# Patient Record
Sex: Female | Born: 1995 | Race: Black or African American | Hispanic: No | Marital: Single | Smoking: Current every day smoker
Health system: Southern US, Community
[De-identification: ages and names within clinical notes are randomized; demographics above are authoritative.]

## PROBLEM LIST (undated history)

## (undated) DIAGNOSIS — F329 Major depressive disorder, single episode, unspecified: Secondary | ICD-10-CM

## (undated) DIAGNOSIS — F32A Depression, unspecified: Secondary | ICD-10-CM

## (undated) DIAGNOSIS — R51 Headache: Secondary | ICD-10-CM

## (undated) DIAGNOSIS — R519 Headache, unspecified: Secondary | ICD-10-CM

## (undated) DIAGNOSIS — K219 Gastro-esophageal reflux disease without esophagitis: Secondary | ICD-10-CM

## (undated) DIAGNOSIS — S83289A Other tear of lateral meniscus, current injury, unspecified knee, initial encounter: Secondary | ICD-10-CM

## (undated) DIAGNOSIS — J45909 Unspecified asthma, uncomplicated: Secondary | ICD-10-CM

## (undated) HISTORY — PX: NO PAST SURGERIES: SHX2092

---
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
## 2016-07-12 DIAGNOSIS — Z832 Family history of diseases of the blood and blood-forming organs and certain disorders involving the immune mechanism: Secondary | ICD-10-CM

## 2016-08-11 DIAGNOSIS — Z862 Personal history of diseases of the blood and blood-forming organs and certain disorders involving the immune mechanism: Secondary | ICD-10-CM | POA: Diagnosis not present

## 2016-08-20 DIAGNOSIS — N3001 Acute cystitis with hematuria: Secondary | ICD-10-CM | POA: Diagnosis not present

## 2016-08-20 DIAGNOSIS — N760 Acute vaginitis: Secondary | ICD-10-CM | POA: Diagnosis not present

## 2016-08-24 DIAGNOSIS — M5431 Sciatica, right side: Secondary | ICD-10-CM | POA: Diagnosis not present

## 2016-08-24 DIAGNOSIS — M545 Low back pain: Secondary | ICD-10-CM | POA: Diagnosis not present

## 2016-08-24 DIAGNOSIS — M5432 Sciatica, left side: Secondary | ICD-10-CM | POA: Diagnosis not present

## 2016-09-02 DIAGNOSIS — M545 Low back pain: Secondary | ICD-10-CM | POA: Diagnosis not present

## 2016-09-02 DIAGNOSIS — M791 Myalgia: Secondary | ICD-10-CM | POA: Diagnosis not present

## 2016-09-02 DIAGNOSIS — M5431 Sciatica, right side: Secondary | ICD-10-CM | POA: Diagnosis not present

## 2016-09-08 DIAGNOSIS — M545 Low back pain: Secondary | ICD-10-CM | POA: Diagnosis not present

## 2016-09-08 DIAGNOSIS — M5136 Other intervertebral disc degeneration, lumbar region: Secondary | ICD-10-CM | POA: Diagnosis not present

## 2016-09-08 DIAGNOSIS — M5127 Other intervertebral disc displacement, lumbosacral region: Secondary | ICD-10-CM | POA: Diagnosis not present

## 2016-09-15 DIAGNOSIS — M5126 Other intervertebral disc displacement, lumbar region: Secondary | ICD-10-CM | POA: Diagnosis not present

## 2016-09-23 ENCOUNTER — Ambulatory Visit (INDEPENDENT_AMBULATORY_CARE_PROVIDER_SITE_OTHER): Payer: Medicare Other | Admitting: Orthopaedic Surgery

## 2016-09-23 ENCOUNTER — Encounter (INDEPENDENT_AMBULATORY_CARE_PROVIDER_SITE_OTHER): Payer: Self-pay | Admitting: Orthopaedic Surgery

## 2016-09-23 VITALS — BP 108/68 | HR 65 | Ht 66.0 in | Wt 209.0 lb

## 2016-09-23 DIAGNOSIS — M5136 Other intervertebral disc degeneration, lumbar region: Secondary | ICD-10-CM

## 2016-09-23 NOTE — Progress Notes (Signed)
Office Visit Note   Patient: Brandy Duncan           Date of Birth: December 25, 1995           MRN: [id number] Visit Date: 09/23/2016              Requested by: No referring provider defined for this encounter. PCP: No PCP Per Patient   Assessment & Plan: Visit Diagnoses:  1. Other intervertebral disc degeneration, lumbar region     Plan: We discussed she needs to get her BMI below 25 and needs to lose about 50 pounds. She gained some weight after the birth of her child. She's been working as a standing job. Slip given that she is okay for work and can occasionally sit to work if possible. Will set up for single epidural injection for the L4-5 central disc protrusion L3 get her some relief. We discussed with her that it be best that she not take narcotic pain medication and the side effects and problems with long-term narcotic use.  Follow-Up Instructions: No Follow-up on file.   Orders:  No orders of the defined types were placed in this encounter.  No orders of the defined types were placed in this encounter.     Procedures: No procedures performed   Clinical Data: No additional findings.   Subjective: Chief Complaint  Patient presents with  . Lower Back - Pain    HPI patient 21 years old she's had an MRI scan and is here for second opinion. She was told she needed an epidural steroid injection. Patient had some history of back pain back in high school. She's now been working at [employer] and is here with her 67-year-old child also her mother is a patient of ours. Patient had been taking some hydrocodone also gabapentin.  Review of Systems the assistance performed. Patient's had some intermittent back pain she had childbirth is a 36-year-old. She is a current smoker we discussed smoking cessation. Occasional alcohol usage she says some mild depression. Rest of the review of systems as it pertains to her history of present illness is negative. Patient has been on a  recent prescription for hydrocodone she had the reaction with the tramadol she is also taking some gabapentin   Objective: Vital Signs: BP 108/68   Pulse 65   Ht  (1.676 m)   Wt 209 lb (94.8 kg)   BMI 33.73 kg/m   Physical Exam  Constitutional: She is oriented to person, place, and time. She appears well-developed.  HENT:  Head: Normocephalic.  Right Ear: External ear normal.  Left Ear: External ear normal.  Eyes: Pupils are equal, round, and reactive to light.  Neck: No tracheal deviation present. No thyromegaly present.  Cardiovascular: Normal rate.   Pulmonary/Chest: Effort normal.  Abdominal: Soft.  Musculoskeletal:  Patient is a straight leg raising 90. She has normal heel toe gait. EHL anterior tib is strong negative popped a compression test. Both hip range of motion these reach full extension. Good quad strength. Negative sciatic notch tenderness. No rash or exposed skin pelvis is level the lumbar spasms.  Neurological: She is alert and oriented to person, place, and time.  Skin: Skin is warm and dry.  Psychiatric: She has a normal mood and affect. Her behavior is normal.    Ortho Exam  Specialty Comments:  No specialty comments available.  Imaging: MRI scan of this 21 year old performed on 09/08/2016 shows central disc protrusion indenting the thecal sac without  significant stenosis. Foramina are patent and L4-5 but she does have significant disc dehydration at the bottom 2 disc levels. Minimal protrusion of 51. She has a high intensity zone at L4-5. Scans were reviewed and copied report was given to the patient.   PMFS History: There are no active problems to display for this patient.  No past medical history on file.  No family history on file.  No past surgical history on file. Social History   Occupational History  . Not on file.   Social History Main Topics  . Smoking status: Current Every Day Smoker  . Smokeless tobacco: Never Used  . Alcohol  use Yes     Comment: occasionally  . Drug use: No  . Sexual activity: Not on file

## 2016-10-19 DIAGNOSIS — R609 Edema, unspecified: Secondary | ICD-10-CM | POA: Diagnosis not present

## 2016-10-19 DIAGNOSIS — R262 Difficulty in walking, not elsewhere classified: Secondary | ICD-10-CM | POA: Diagnosis not present

## 2016-10-19 DIAGNOSIS — M25562 Pain in left knee: Secondary | ICD-10-CM | POA: Diagnosis not present

## 2016-10-21 ENCOUNTER — Telehealth (INDEPENDENT_AMBULATORY_CARE_PROVIDER_SITE_OTHER): Payer: Self-pay | Admitting: *Deleted

## 2016-10-21 ENCOUNTER — Other Ambulatory Visit (INDEPENDENT_AMBULATORY_CARE_PROVIDER_SITE_OTHER): Payer: Self-pay | Admitting: Radiology

## 2016-10-21 ENCOUNTER — Ambulatory Visit (INDEPENDENT_AMBULATORY_CARE_PROVIDER_SITE_OTHER): Payer: Medicare Other | Admitting: Family

## 2016-10-21 ENCOUNTER — Ambulatory Visit (INDEPENDENT_AMBULATORY_CARE_PROVIDER_SITE_OTHER): Payer: Medicare Other

## 2016-10-21 DIAGNOSIS — S83422A Sprain of lateral collateral ligament of left knee, initial encounter: Secondary | ICD-10-CM

## 2016-10-21 DIAGNOSIS — S8992XA Unspecified injury of left lower leg, initial encounter: Secondary | ICD-10-CM | POA: Diagnosis not present

## 2016-10-21 DIAGNOSIS — M5136 Other intervertebral disc degeneration, lumbar region: Secondary | ICD-10-CM

## 2016-10-21 NOTE — Addendum Note (Signed)
Addended by: Barnie DelZAMORA, ERIN R on: 10/21/2016 05:12 PM   Modules accepted: Orders
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
## 2016-10-21 NOTE — Telephone Encounter (Signed)
Patient came in this morning to be seen for her knee and she wanted to know what was the status of her being referred to Dr. Alvester MorinNewton? She had seen Dr Ophelia CharterYates a few weeks ago for her back pain and it had been discussed for her to possibly get an injection to help with her lumbar pain. Her CB # [phone]. Thank you

## 2016-10-21 NOTE — Telephone Encounter (Signed)
[facility] Per last office note, Dr. Ophelia CharterYates and patient did discuss one time injection. [facility]Newton [facility].  I attempted to call patient to advise order has been placed, but was unable to reach her. I was unable to leave a message.

## 2016-10-22 ENCOUNTER — Telehealth (INDEPENDENT_AMBULATORY_CARE_PROVIDER_SITE_OTHER): Payer: Self-pay | Admitting: Family

## 2016-10-22 ENCOUNTER — Other Ambulatory Visit (INDEPENDENT_AMBULATORY_CARE_PROVIDER_SITE_OTHER): Payer: Self-pay | Admitting: Family

## 2016-10-22 ENCOUNTER — Telehealth (INDEPENDENT_AMBULATORY_CARE_PROVIDER_SITE_OTHER): Payer: Self-pay | Admitting: Radiology

## 2016-10-22 MED ORDER — NAPROXEN SODIUM 550 MG PO TABS: 550.0000 mg | ORAL_TABLET | Freq: Two times a day (BID) | ORAL | 0 refills | Status: DC

## 2016-10-22 NOTE — Telephone Encounter (Signed)
Called and lm on vm to advise that the 500 mg is ok per Denny PeonErin.

## 2016-10-22 NOTE — Telephone Encounter (Signed)
Patient's mother, Elnita MaxwellCheryl, states that her daughter was supposed to be prescribed prednisone at yesterday's visit, but the pharmacy still does not have anything. She uses [facility] in [place], phone [phone].  Please call mom to advise. [phone]

## 2016-10-22 NOTE — Telephone Encounter (Signed)
Alli with [facility] called advised they do not carry the 550mg  of Naproxen. She asked if she can fill the Rx for 500mg  instead? The number to contact Alli is [phone]

## 2016-10-22 NOTE — Telephone Encounter (Signed)
Naproxen sent to [facility] in [place]. I tried calling patients but there is no answer.

## 2016-10-22 NOTE — Addendum Note (Signed)
Addended by: Donalee CitrinPEELE, STEPHENEY L on: 10/22/2016 10:56 AM   Modules accepted: Orders

## 2016-10-27 ENCOUNTER — Encounter (INDEPENDENT_AMBULATORY_CARE_PROVIDER_SITE_OTHER): Payer: Self-pay

## 2016-10-27 ENCOUNTER — Ambulatory Visit (INDEPENDENT_AMBULATORY_CARE_PROVIDER_SITE_OTHER): Payer: Self-pay | Admitting: Orthopaedic Surgery

## 2016-11-03 ENCOUNTER — Ambulatory Visit (INDEPENDENT_AMBULATORY_CARE_PROVIDER_SITE_OTHER): Payer: Medicaid Other | Admitting: Family

## 2016-11-04 ENCOUNTER — Ambulatory Visit (INDEPENDENT_AMBULATORY_CARE_PROVIDER_SITE_OTHER): Payer: Medicare Other | Admitting: Family

## 2016-11-19 DIAGNOSIS — R51 Headache: Secondary | ICD-10-CM | POA: Diagnosis not present

## 2016-11-19 DIAGNOSIS — Z5329 Procedure and treatment not carried out because of patient's decision for other reasons: Secondary | ICD-10-CM | POA: Diagnosis not present

## 2016-12-30 DIAGNOSIS — G8929 Other chronic pain: Secondary | ICD-10-CM | POA: Diagnosis not present

## 2016-12-30 DIAGNOSIS — E790 Hyperuricemia without signs of inflammatory arthritis and tophaceous disease: Secondary | ICD-10-CM | POA: Diagnosis not present

## 2016-12-30 DIAGNOSIS — E538 Deficiency of other specified B group vitamins: Secondary | ICD-10-CM | POA: Diagnosis not present

## 2016-12-30 DIAGNOSIS — J069 Acute upper respiratory infection, unspecified: Secondary | ICD-10-CM | POA: Diagnosis not present

## 2016-12-30 DIAGNOSIS — E782 Mixed hyperlipidemia: Secondary | ICD-10-CM | POA: Diagnosis not present

## 2016-12-30 DIAGNOSIS — D649 Anemia, unspecified: Secondary | ICD-10-CM | POA: Diagnosis not present

## 2016-12-30 DIAGNOSIS — E1165 Type 2 diabetes mellitus with hyperglycemia: Secondary | ICD-10-CM | POA: Diagnosis not present

## 2016-12-30 DIAGNOSIS — Z130 Encounter for screening for diseases of the blood and blood-forming organs and certain disorders involving the immune mechanism: Secondary | ICD-10-CM | POA: Diagnosis not present

## 2016-12-30 DIAGNOSIS — E039 Hypothyroidism, unspecified: Secondary | ICD-10-CM | POA: Diagnosis not present

## 2016-12-30 DIAGNOSIS — J029 Acute pharyngitis, unspecified: Secondary | ICD-10-CM | POA: Diagnosis not present

## 2016-12-30 DIAGNOSIS — R898 Other abnormal findings in specimens from other organs, systems and tissues: Secondary | ICD-10-CM | POA: Diagnosis not present

## 2016-12-30 DIAGNOSIS — E559 Vitamin D deficiency, unspecified: Secondary | ICD-10-CM | POA: Diagnosis not present

## 2016-12-30 DIAGNOSIS — I10 Essential (primary) hypertension: Secondary | ICD-10-CM | POA: Diagnosis not present

## 2017-01-05 ENCOUNTER — Ambulatory Visit (INDEPENDENT_AMBULATORY_CARE_PROVIDER_SITE_OTHER): Payer: Medicare Other | Admitting: Orthopedic Surgery

## 2017-01-05 ENCOUNTER — Encounter (INDEPENDENT_AMBULATORY_CARE_PROVIDER_SITE_OTHER): Payer: Self-pay | Admitting: Orthopedic Surgery

## 2017-01-05 DIAGNOSIS — S8992XA Unspecified injury of left lower leg, initial encounter: Secondary | ICD-10-CM | POA: Diagnosis not present
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
## 2017-01-12 ENCOUNTER — Encounter (INDEPENDENT_AMBULATORY_CARE_PROVIDER_SITE_OTHER): Payer: Medicare Other | Admitting: Physical Medicine and Rehabilitation

## 2017-01-18 DIAGNOSIS — Z043 Encounter for examination and observation following other accident: Secondary | ICD-10-CM | POA: Diagnosis not present

## 2017-01-18 DIAGNOSIS — M25462 Effusion, left knee: Secondary | ICD-10-CM | POA: Diagnosis not present

## 2017-01-18 DIAGNOSIS — M25562 Pain in left knee: Secondary | ICD-10-CM | POA: Diagnosis not present

## 2017-01-18 DIAGNOSIS — M25362 Other instability, left knee: Secondary | ICD-10-CM | POA: Diagnosis not present

## 2017-01-21 ENCOUNTER — Ambulatory Visit (INDEPENDENT_AMBULATORY_CARE_PROVIDER_SITE_OTHER): Payer: Medicare Other | Admitting: Orthopedic Surgery

## 2017-01-21 DIAGNOSIS — E559 Vitamin D deficiency, unspecified: Secondary | ICD-10-CM | POA: Diagnosis not present

## 2017-01-21 DIAGNOSIS — Z1379 Encounter for other screening for genetic and chromosomal anomalies: Secondary | ICD-10-CM | POA: Diagnosis not present

## 2017-01-21 DIAGNOSIS — R2689 Other abnormalities of gait and mobility: Secondary | ICD-10-CM | POA: Diagnosis not present

## 2017-01-21 DIAGNOSIS — E663 Overweight: Secondary | ICD-10-CM | POA: Diagnosis not present

## 2017-01-21 DIAGNOSIS — Z1389 Encounter for screening for other disorder: Secondary | ICD-10-CM | POA: Diagnosis not present

## 2017-01-21 DIAGNOSIS — M25562 Pain in left knee: Secondary | ICD-10-CM | POA: Diagnosis not present

## 2017-01-21 DIAGNOSIS — F3160 Bipolar disorder, current episode mixed, unspecified: Secondary | ICD-10-CM | POA: Diagnosis not present

## 2017-01-21 DIAGNOSIS — M79609 Pain in unspecified limb: Secondary | ICD-10-CM | POA: Diagnosis not present

## 2017-01-21 DIAGNOSIS — F329 Major depressive disorder, single episode, unspecified: Secondary | ICD-10-CM | POA: Diagnosis not present

## 2017-01-24 ENCOUNTER — Ambulatory Visit (INDEPENDENT_AMBULATORY_CARE_PROVIDER_SITE_OTHER): Payer: Medicare Other | Admitting: Family

## 2017-01-24 ENCOUNTER — Encounter (INDEPENDENT_AMBULATORY_CARE_PROVIDER_SITE_OTHER): Payer: Self-pay | Admitting: Family

## 2017-01-24 VITALS — Ht 66.0 in | Wt 209.0 lb

## 2017-01-24 DIAGNOSIS — M25562 Pain in left knee: Secondary | ICD-10-CM

## 2017-01-24 DIAGNOSIS — M2392 Unspecified internal derangement of left knee: Secondary | ICD-10-CM

## 2017-01-26 DIAGNOSIS — M25562 Pain in left knee: Secondary | ICD-10-CM | POA: Insufficient documentation
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
## 2017-02-08 ENCOUNTER — Telehealth (INDEPENDENT_AMBULATORY_CARE_PROVIDER_SITE_OTHER): Payer: Self-pay | Admitting: Physical Medicine and Rehabilitation

## 2017-02-08 NOTE — Telephone Encounter (Signed)
Patient came in wanting to reschedule her back injection with Dr. Alvester Morin. CB # [phone]  **For some reason when calling her number it does not go through, Brandy Duncan has to call using her cell phone**

## 2017-02-09 DIAGNOSIS — D709 Neutropenia, unspecified: Secondary | ICD-10-CM | POA: Diagnosis not present

## 2017-02-09 NOTE — Telephone Encounter (Signed)
Called patient from my cell phone and left message.

## 2017-02-10 ENCOUNTER — Other Ambulatory Visit (INDEPENDENT_AMBULATORY_CARE_PROVIDER_SITE_OTHER): Payer: Self-pay | Admitting: Orthopedic Surgery

## 2017-02-10 ENCOUNTER — Encounter (INDEPENDENT_AMBULATORY_CARE_PROVIDER_SITE_OTHER): Payer: Self-pay | Admitting: Orthopedic Surgery

## 2017-02-10 ENCOUNTER — Ambulatory Visit (INDEPENDENT_AMBULATORY_CARE_PROVIDER_SITE_OTHER): Payer: Medicare Other | Admitting: Orthopedic Surgery

## 2017-02-10 DIAGNOSIS — S83282D Other tear of lateral meniscus, current injury, left knee, subsequent encounter: Secondary | ICD-10-CM | POA: Diagnosis not present

## 2017-02-10 DIAGNOSIS — M25562 Pain in left knee: Secondary | ICD-10-CM | POA: Diagnosis not present

## 2017-02-10 DIAGNOSIS — S83282A Other tear of lateral meniscus, current injury, left knee, initial encounter: Secondary | ICD-10-CM

## 2017-02-10 DIAGNOSIS — Z5181 Encounter for therapeutic drug level monitoring: Secondary | ICD-10-CM | POA: Diagnosis not present

## 2017-02-10 DIAGNOSIS — R2689 Other abnormalities of gait and mobility: Secondary | ICD-10-CM | POA: Diagnosis not present
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
## 2017-02-16 DIAGNOSIS — D709 Neutropenia, unspecified: Secondary | ICD-10-CM | POA: Diagnosis not present

## 2017-02-16 DIAGNOSIS — R5383 Other fatigue: Secondary | ICD-10-CM | POA: Diagnosis not present

## 2017-02-16 DIAGNOSIS — R634 Abnormal weight loss: Secondary | ICD-10-CM | POA: Diagnosis not present

## 2017-02-16 DIAGNOSIS — R911 Solitary pulmonary nodule: Secondary | ICD-10-CM | POA: Diagnosis not present

## 2017-03-08 ENCOUNTER — Telehealth (INDEPENDENT_AMBULATORY_CARE_PROVIDER_SITE_OTHER): Payer: Self-pay | Admitting: Radiology

## 2017-03-08 NOTE — Telephone Encounter (Signed)
Please advise thanks.

## 2017-03-08 NOTE — Telephone Encounter (Signed)
Note written for patient.

## 2017-03-08 NOTE — Telephone Encounter (Signed)
Patient's emergency contact Mylinda Latina calling about patient. She is requesting a letter for the patient, stating when her court date is and how long patient will not be able to walk. She believes it was 9 weeks but is not certain. She states her surgery is on 03/15/18 with Dr. August Saucer. She is needing this letter for a new court date. Elnita Maxwell would like a call once it is done, she is coming tomorrow after her appointment to pick up.

## 2017-03-08 NOTE — Pre-Procedure Instructions (Signed)
Brandy Duncan  03/08/2017      [facility] - [place], [place] - [address] [place] [address] Phone: [phone] Fax: [phone]    Your procedure is scheduled on March 15, 2017 .  Report to [facility] Admitting at 0900 A.M.  Call this number if you have problems the morning of surgery:  [phone]   Remember:  Do not eat food or drink liquids after midnight.  Take these medicines the morning of surgery with A SIP OF WATER: Gabapentin (Neurontin) - if needed Hydrocodone-acetaminophen (Norco) - if needed  7 days prior to surgery STOP taking any Aspirin, Aleve, Naproxen, Ibuprofen, Motrin, Advil, Goody's, BC's, all herbal medications, fish oil, and all vitamins    Do not wear jewelry, make-up or nail polish.  Do not wear lotions, powders, or perfumes, or deoderant.  Do not shave 48 hours prior to surgery.    Do not bring valuables to the hospital.  [facility] is not responsible for any belongings or valuables.  Contacts, eyeglasses, dentures or bridgework may not be worn into surgery.  Leave your suitcase in the car.  After surgery it may be brought to your room.  For patients admitted to the hospital, discharge time will be determined by your treatment team.  Patients discharged the day of surgery will not be allowed to drive home.   Name and phone number of your driver:    Special instructions:   [facility]- Preparing For Surgery  Before surgery, you can play an important role. Because skin is not sterile, your skin needs to be as free of germs as possible. You can reduce the number of germs on your skin by washing with CHG (chlorahexidine gluconate) Soap before surgery.  CHG is an antiseptic cleaner which kills germs and bonds with the skin to continue killing germs even after washing.  Please do not use if you have an allergy to CHG or antibacterial soaps. If your skin becomes reddened/irritated stop using the  CHG.  Do not shave (including legs and underarms) for at least 48 hours prior to first CHG shower. It is OK to shave your face.  Please follow these instructions carefully.   1. Shower the NIGHT BEFORE SURGERY and the MORNING OF SURGERY with CHG.   2. If you chose to wash your hair, wash your hair first as usual with your normal shampoo.  3. After you shampoo, rinse your hair and body thoroughly to remove the shampoo.  4. Use CHG as you would any other liquid soap. You can apply CHG directly to the skin and wash gently with a scrungie or a clean washcloth.   5. Apply the CHG Soap to your body ONLY FROM THE NECK DOWN.  Do not use on open wounds or open sores. Avoid contact with your eyes, ears, mouth and genitals (private parts). Wash genitals (private parts) with your normal soap.  6. Wash thoroughly, paying special attention to the area where your surgery will be performed.  7. Thoroughly rinse your body with warm water from the neck down.  8. DO NOT shower/wash with your normal soap after using and rinsing off the CHG Soap.  9. Pat yourself dry with a CLEAN TOWEL.   10. Wear CLEAN PAJAMAS   11. Place CLEAN SHEETS on your bed the night of your first shower and DO NOT SLEEP WITH PETS.    Day of Surgery: Shower as stated above Do not  apply any deodorants/lotions. Please wear clean clothes to the hospital/surgery center.      Please read over the following fact sheets that you were given. Pain Booklet, Coughing and Deep Breathing and Surgical Site Infection Prevention

## 2017-03-08 NOTE — Telephone Encounter (Signed)
Non weight bearing for 6 weeks pls caal thx

## 2017-03-09 ENCOUNTER — Encounter (HOSPITAL_COMMUNITY): Payer: Self-pay | Admitting: *Deleted

## 2017-03-09 ENCOUNTER — Encounter (HOSPITAL_COMMUNITY)
Admission: RE | Admit: 2017-03-09 | Discharge: 2017-03-09 | Disposition: A | Discharge status: Home / Self Care | Payer: Medicare Other | Source: Ambulatory Visit | Attending: Orthopedic Surgery | Admitting: Orthopedic Surgery

## 2017-03-09 DIAGNOSIS — S83282A Other tear of lateral meniscus, current injury, left knee, initial encounter: Secondary | ICD-10-CM | POA: Diagnosis not present

## 2017-03-09 DIAGNOSIS — Z01812 Encounter for preprocedural laboratory examination: Secondary | ICD-10-CM | POA: Insufficient documentation

## 2017-03-09 DIAGNOSIS — X58XXXA Exposure to other specified factors, initial encounter: Secondary | ICD-10-CM | POA: Diagnosis not present

## 2017-03-09 HISTORY — DX: Other tear of lateral meniscus, current injury, unspecified knee, initial encounter: S83.289A

## 2017-03-09 HISTORY — DX: Headache, unspecified: R51.9

## 2017-03-09 HISTORY — DX: Headache: R51

## 2017-03-09 HISTORY — DX: Depression, unspecified: F32.A

## 2017-03-09 HISTORY — DX: Major depressive disorder, single episode, unspecified: F32.9

## 2017-03-09 LAB — CBC
HCT: 38.3 % (ref 36.0–46.0)
Hemoglobin: 12.7 g/dL (ref 12.0–15.0)
MCH: 30.1 pg (ref 26.0–34.0)
MCHC: 33.2 g/dL (ref 30.0–36.0)
MCV: 90.8 fL (ref 78.0–100.0)
Platelets: 252 10*3/uL (ref 150–400)
RBC: 4.22 MIL/uL (ref 3.87–5.11)
RDW: 12.4 % (ref 11.5–15.5)
WBC: 4.2 10*3/uL (ref 4.0–10.5)

## 2017-03-09 LAB — HCG, SERUM, QUALITATIVE: Preg, Serum: NEGATIVE

## 2017-03-09 NOTE — Progress Notes (Signed)
Pt denies SOB, chest pain, and being under the care of a cardiologist. Pt denies having a stress test, echo and cardiac cath. Pt denies having an EKG and chest x ray within the last year. Pt denies recent labs.  

## 2017-03-14 DIAGNOSIS — J4520 Mild intermittent asthma, uncomplicated: Secondary | ICD-10-CM | POA: Diagnosis not present

## 2017-03-14 DIAGNOSIS — F17210 Nicotine dependence, cigarettes, uncomplicated: Secondary | ICD-10-CM | POA: Diagnosis not present

## 2017-03-14 DIAGNOSIS — R911 Solitary pulmonary nodule: Secondary | ICD-10-CM | POA: Diagnosis not present

## 2017-03-14 MED ORDER — CEFAZOLIN SODIUM-DEXTROSE 2-4 GM/100ML-% IV SOLN: 2.0000 g | INTRAVENOUS | Status: DC

## 2017-03-15 ENCOUNTER — Encounter (HOSPITAL_COMMUNITY): Admission: RE | Payer: Self-pay | Source: Ambulatory Visit

## 2017-03-15 ENCOUNTER — Encounter (HOSPITAL_COMMUNITY): Payer: Self-pay | Admitting: Certified Registered Nurse Anesthetist

## 2017-03-15 ENCOUNTER — Ambulatory Visit (HOSPITAL_COMMUNITY): Admission: RE | Admit: 2017-03-15 | Payer: Medicare Other | Source: Ambulatory Visit | Admitting: Orthopedic Surgery

## 2017-03-15 SURGERY — ARTHROSCOPY, KNEE, WITH MENISCUS REPAIR
Anesthesia: General | Laterality: Left

## 2017-03-15 MED ORDER — FENTANYL CITRATE (PF) 250 MCG/5ML IJ SOLN
INTRAMUSCULAR | Status: AC
  Filled 2017-03-15: qty 5

## 2017-03-15 MED ORDER — ROCURONIUM BROMIDE 10 MG/ML (PF) SYRINGE
PREFILLED_SYRINGE | INTRAVENOUS | Status: AC
  Filled 2017-03-15: qty 5

## 2017-03-15 MED ORDER — ONDANSETRON HCL 4 MG/2ML IJ SOLN
INTRAMUSCULAR | Status: AC
  Filled 2017-03-15: qty 2

## 2017-03-15 MED ORDER — DEXAMETHASONE SODIUM PHOSPHATE 10 MG/ML IJ SOLN
INTRAMUSCULAR | Status: AC
  Filled 2017-03-15: qty 1

## 2017-03-15 MED ORDER — PROPOFOL 10 MG/ML IV BOLUS
INTRAVENOUS | Status: AC
  Filled 2017-03-15: qty 20

## 2017-03-15 MED ORDER — LIDOCAINE 2% (20 MG/ML) 5 ML SYRINGE
INTRAMUSCULAR | Status: AC
  Filled 2017-03-15: qty 5

## 2017-03-23 ENCOUNTER — Inpatient Hospital Stay (INDEPENDENT_AMBULATORY_CARE_PROVIDER_SITE_OTHER): Payer: Medicare Other | Admitting: Orthopedic Surgery

## 2017-03-28 ENCOUNTER — Telehealth (INDEPENDENT_AMBULATORY_CARE_PROVIDER_SITE_OTHER): Payer: Self-pay | Admitting: Orthopedic Surgery

## 2017-03-28 NOTE — Telephone Encounter (Signed)
Patient called wanting to reschedule surgery with Dr. August Saucer. Stated she has been leaving messages and no one has called her back. Please call

## 2017-03-29 NOTE — Telephone Encounter (Signed)
I called and left voice mail for return call to schedule. 

## 2017-04-04 ENCOUNTER — Other Ambulatory Visit (INDEPENDENT_AMBULATORY_CARE_PROVIDER_SITE_OTHER): Payer: Self-pay | Admitting: Orthopedic Surgery

## 2017-04-04 DIAGNOSIS — S83282A Other tear of lateral meniscus, current injury, left knee, initial encounter: Secondary | ICD-10-CM

## 2017-04-22 ENCOUNTER — Encounter (HOSPITAL_COMMUNITY): Payer: Self-pay | Admitting: *Deleted

## 2017-04-22 NOTE — Progress Notes (Signed)
Spoke with pt for pre-op call. Pt denies cardiac history. 

## 2017-04-25 ENCOUNTER — Ambulatory Visit (HOSPITAL_COMMUNITY): Payer: Medicare Other | Admitting: Certified Registered"

## 2017-04-25 ENCOUNTER — Other Ambulatory Visit: Payer: Self-pay

## 2017-04-25 ENCOUNTER — Encounter (HOSPITAL_COMMUNITY)
Admission: RE | Disposition: A | Discharge status: Home / Self Care | Payer: Self-pay | Source: Ambulatory Visit | Attending: Orthopedic Surgery

## 2017-04-25 ENCOUNTER — Encounter (HOSPITAL_COMMUNITY): Payer: Self-pay | Admitting: *Deleted

## 2017-04-25 ENCOUNTER — Ambulatory Visit (HOSPITAL_COMMUNITY)
Admission: RE | Admit: 2017-04-25 | Discharge: 2017-04-25 | Disposition: A | Discharge status: Home / Self Care | Payer: Medicare Other | Source: Ambulatory Visit | Attending: Orthopedic Surgery | Admitting: Orthopedic Surgery

## 2017-04-25 DIAGNOSIS — S83282A Other tear of lateral meniscus, current injury, left knee, initial encounter: Secondary | ICD-10-CM | POA: Insufficient documentation

## 2017-04-25 DIAGNOSIS — M25562 Pain in left knee: Secondary | ICD-10-CM

## 2017-04-25 DIAGNOSIS — X58XXXA Exposure to other specified factors, initial encounter: Secondary | ICD-10-CM | POA: Insufficient documentation

## 2017-04-25 DIAGNOSIS — Y929 Unspecified place or not applicable: Secondary | ICD-10-CM | POA: Insufficient documentation

## 2017-04-25 DIAGNOSIS — F329 Major depressive disorder, single episode, unspecified: Secondary | ICD-10-CM | POA: Insufficient documentation

## 2017-04-25 DIAGNOSIS — J45909 Unspecified asthma, uncomplicated: Secondary | ICD-10-CM | POA: Insufficient documentation

## 2017-04-25 DIAGNOSIS — Z79899 Other long term (current) drug therapy: Secondary | ICD-10-CM | POA: Insufficient documentation

## 2017-04-25 DIAGNOSIS — Q686 Discoid meniscus: Secondary | ICD-10-CM | POA: Diagnosis not present

## 2017-04-25 DIAGNOSIS — F17210 Nicotine dependence, cigarettes, uncomplicated: Secondary | ICD-10-CM | POA: Diagnosis not present

## 2017-04-25 DIAGNOSIS — K219 Gastro-esophageal reflux disease without esophagitis: Secondary | ICD-10-CM | POA: Insufficient documentation

## 2017-04-25 DIAGNOSIS — S83282D Other tear of lateral meniscus, current injury, left knee, subsequent encounter: Secondary | ICD-10-CM | POA: Diagnosis not present

## 2017-04-25 HISTORY — DX: Gastro-esophageal reflux disease without esophagitis: K21.9

## 2017-04-25 HISTORY — PX: KNEE ARTHROSCOPY WITH MENISCAL REPAIR: SHX5653

## 2017-04-25 HISTORY — DX: Unspecified asthma, uncomplicated: J45.909

## 2017-04-25 LAB — CBC
HCT: 37.2 % (ref 36.0–46.0)
Hemoglobin: 12.7 g/dL (ref 12.0–15.0)
MCH: 30.7 pg (ref 26.0–34.0)
MCHC: 34.1 g/dL (ref 30.0–36.0)
MCV: 89.9 fL (ref 78.0–100.0)
Platelets: 262 10*3/uL (ref 150–400)
RBC: 4.14 MIL/uL (ref 3.87–5.11)
RDW: 12.4 % (ref 11.5–15.5)
WBC: 7.3 10*3/uL (ref 4.0–10.5)

## 2017-04-25 LAB — HCG, SERUM, QUALITATIVE: Preg, Serum: NEGATIVE

## 2017-04-25 SURGERY — ARTHROSCOPY, KNEE, WITH MENISCUS REPAIR
Anesthesia: General | Site: Knee | Laterality: Left

## 2017-04-25 MED ORDER — FENTANYL CITRATE (PF) 250 MCG/5ML IJ SOLN
INTRAMUSCULAR | Status: DC | PRN
  Administered 2017-04-25 (×2): 50 ug via INTRAVENOUS

## 2017-04-25 MED ORDER — SODIUM CHLORIDE 0.9 % IR SOLN
Status: DC | PRN
  Administered 2017-04-25: 12000 mL

## 2017-04-25 MED ORDER — CEFAZOLIN SODIUM-DEXTROSE 2-4 GM/100ML-% IV SOLN
2.0000 g | INTRAVENOUS | Status: AC
  Administered 2017-04-25: 2 g via INTRAVENOUS

## 2017-04-25 MED ORDER — FENTANYL CITRATE (PF) 250 MCG/5ML IJ SOLN
INTRAMUSCULAR | Status: AC
  Filled 2017-04-25: qty 5

## 2017-04-25 MED ORDER — BUPIVACAINE HCL (PF) 0.25 % IJ SOLN
INTRAMUSCULAR | Status: DC | PRN
  Administered 2017-04-25: 20 mL

## 2017-04-25 MED ORDER — ONDANSETRON HCL 4 MG/2ML IJ SOLN
INTRAMUSCULAR | Status: DC | PRN
  Administered 2017-04-25: 4 mg via INTRAVENOUS

## 2017-04-25 MED ORDER — CHLORHEXIDINE GLUCONATE 4 % EX LIQD: 60.0000 mL | Freq: Once | CUTANEOUS | Status: DC

## 2017-04-25 MED ORDER — CLONIDINE HCL (ANALGESIA) 100 MCG/ML EP SOLN
150.0000 ug | Freq: Once | EPIDURAL | Status: DC
  Filled 2017-04-25: qty 1.5

## 2017-04-25 MED ORDER — ONDANSETRON HCL 4 MG/2ML IJ SOLN
INTRAMUSCULAR | Status: AC
  Filled 2017-04-25: qty 2

## 2017-04-25 MED ORDER — DEXAMETHASONE SODIUM PHOSPHATE 10 MG/ML IJ SOLN
INTRAMUSCULAR | Status: AC
  Filled 2017-04-25: qty 1

## 2017-04-25 MED ORDER — SODIUM CHLORIDE 0.9 % IR SOLN
Status: DC | PRN
  Administered 2017-04-25: 6000 mL

## 2017-04-25 MED ORDER — CEFAZOLIN SODIUM-DEXTROSE 2-4 GM/100ML-% IV SOLN
INTRAVENOUS | Status: AC
  Filled 2017-04-25: qty 100

## 2017-04-25 MED ORDER — PHENYLEPHRINE 40 MCG/ML (10ML) SYRINGE FOR IV PUSH (FOR BLOOD PRESSURE SUPPORT)
PREFILLED_SYRINGE | INTRAVENOUS | Status: DC | PRN
  Administered 2017-04-25 (×2): 80 ug via INTRAVENOUS

## 2017-04-25 MED ORDER — MEPERIDINE HCL 25 MG/ML IJ SOLN: 6.2500 mg | INTRAMUSCULAR | Status: DC | PRN

## 2017-04-25 MED ORDER — PROPOFOL 10 MG/ML IV BOLUS
INTRAVENOUS | Status: DC | PRN
  Administered 2017-04-25: 200 mg via INTRAVENOUS

## 2017-04-25 MED ORDER — MIDAZOLAM HCL 2 MG/2ML IJ SOLN
INTRAMUSCULAR | Status: AC
  Filled 2017-04-25: qty 2

## 2017-04-25 MED ORDER — ONDANSETRON HCL 4 MG/2ML IJ SOLN: 4.0000 mg | Freq: Once | INTRAMUSCULAR | Status: DC | PRN

## 2017-04-25 MED ORDER — HYDROMORPHONE HCL 1 MG/ML IJ SOLN: 0.2500 mg | INTRAMUSCULAR | Status: DC | PRN

## 2017-04-25 MED ORDER — PROPOFOL 10 MG/ML IV BOLUS
INTRAVENOUS | Status: AC
  Filled 2017-04-25: qty 20

## 2017-04-25 MED ORDER — MORPHINE SULFATE (PF) 4 MG/ML IV SOLN
INTRAVENOUS | Status: DC | PRN
  Administered 2017-04-25: 8 mg

## 2017-04-25 MED ORDER — LIDOCAINE 2% (20 MG/ML) 5 ML SYRINGE
INTRAMUSCULAR | Status: DC | PRN
  Administered 2017-04-25: 100 mg via INTRAVENOUS

## 2017-04-25 MED ORDER — LACTATED RINGERS IV SOLN
INTRAVENOUS | Status: DC | PRN
  Administered 2017-04-25 (×2): via INTRAVENOUS

## 2017-04-25 MED ORDER — MORPHINE SULFATE (PF) 4 MG/ML IV SOLN
INTRAVENOUS | Status: AC
  Filled 2017-04-25: qty 1

## 2017-04-25 MED ORDER — LIDOCAINE 2% (20 MG/ML) 5 ML SYRINGE
INTRAMUSCULAR | Status: AC
  Filled 2017-04-25: qty 5

## 2017-04-25 MED ORDER — ONDANSETRON HCL 4 MG/2ML IJ SOLN
INTRAMUSCULAR | Status: AC
  Administered 2017-04-25: 4 mg
  Filled 2017-04-25: qty 2

## 2017-04-25 MED ORDER — BUPIVACAINE HCL (PF) 0.25 % IJ SOLN
INTRAMUSCULAR | Status: AC
  Filled 2017-04-25: qty 30

## 2017-04-25 MED ORDER — CLONIDINE HCL (ANALGESIA) 100 MCG/ML EP SOLN
EPIDURAL | Status: DC | PRN
  Administered 2017-04-25: 100 ug

## 2017-04-25 MED ORDER — DEXAMETHASONE SODIUM PHOSPHATE 10 MG/ML IJ SOLN
INTRAMUSCULAR | Status: DC | PRN
  Administered 2017-04-25: 10 mg via INTRAVENOUS

## 2017-04-25 MED ORDER — MIDAZOLAM HCL 5 MG/5ML IJ SOLN
INTRAMUSCULAR | Status: DC | PRN
  Administered 2017-04-25: 2 mg via INTRAVENOUS

## 2017-04-25 MED ORDER — BUPIVACAINE-EPINEPHRINE (PF) 0.5% -1:200000 IJ SOLN
INTRAMUSCULAR | Status: DC | PRN
  Administered 2017-04-25: 20 mL

## 2017-04-25 MED ORDER — EPINEPHRINE PF 1 MG/ML IJ SOLN
INTRAMUSCULAR | Status: AC
  Filled 2017-04-25: qty 2

## 2017-04-25 MED ORDER — PHENYLEPHRINE 40 MCG/ML (10ML) SYRINGE FOR IV PUSH (FOR BLOOD PRESSURE SUPPORT)
PREFILLED_SYRINGE | INTRAVENOUS | Status: AC
  Filled 2017-04-25: qty 10

## 2017-04-25 MED ORDER — BUPIVACAINE-EPINEPHRINE (PF) 0.5% -1:200000 IJ SOLN
INTRAMUSCULAR | Status: AC
  Filled 2017-04-25: qty 30

## 2017-04-25 SURGICAL SUPPLY — 63 items
ALCOHOL 70% 16 OZ (MISCELLANEOUS) ×2 IMPLANT
BANDAGE ACE 6X5 VEL STRL LF (GAUZE/BANDAGES/DRESSINGS) ×2 IMPLANT
BANDAGE ESMARK 6X9 LF (GAUZE/BANDAGES/DRESSINGS) IMPLANT
BLADE CLIPPER SURG (BLADE) IMPLANT
BLADE CUTTER GATOR 3.5 (BLADE) IMPLANT
BLADE GREAT WHITE 4.2 (BLADE) ×4 IMPLANT
BLADE SURG 10 STRL SS (BLADE) ×2 IMPLANT
BLADE SURG 15 STRL LF DISP TIS (BLADE) ×1 IMPLANT
BLADE SURG 15 STRL SS (BLADE) ×1
BNDG ESMARK 6X9 LF (GAUZE/BANDAGES/DRESSINGS)
BUR OVAL 6.0 (BURR) IMPLANT
COVER MAYO STAND STRL (DRAPES) ×2 IMPLANT
COVER SURGICAL LIGHT HANDLE (MISCELLANEOUS) ×2 IMPLANT
CUFF TOURNIQUET SINGLE 34IN LL (TOURNIQUET CUFF) IMPLANT
CUFF TOURNIQUET SINGLE 44IN (TOURNIQUET CUFF) IMPLANT
DRAPE ARTHROSCOPY W/POUCH 114 (DRAPES) ×2 IMPLANT
DRAPE HALF SHEET 40X57 (DRAPES) IMPLANT
DRAPE INCISE IOBAN 66X45 STRL (DRAPES) IMPLANT
DRAPE U-SHAPE 47X51 STRL (DRAPES) ×2 IMPLANT
DRSG TEGADERM 4X4.75 (GAUZE/BANDAGES/DRESSINGS) ×6 IMPLANT
DURAPREP 26ML APPLICATOR (WOUND CARE) ×2 IMPLANT
ELECT REM PT RETURN 9FT ADLT (ELECTROSURGICAL) ×2
ELECTRODE REM PT RTRN 9FT ADLT (ELECTROSURGICAL) ×1 IMPLANT
GAUZE SPONGE 4X4 12PLY STRL (GAUZE/BANDAGES/DRESSINGS) IMPLANT
GAUZE SPONGE 4X4 12PLY STRL LF (GAUZE/BANDAGES/DRESSINGS) ×2 IMPLANT
GAUZE XEROFORM 1X8 LF (GAUZE/BANDAGES/DRESSINGS) ×2 IMPLANT
GLOVE BIOGEL PI IND STRL 7.5 (GLOVE) ×1 IMPLANT
GLOVE BIOGEL PI IND STRL 8 (GLOVE) ×1 IMPLANT
GLOVE BIOGEL PI INDICATOR 7.5 (GLOVE) ×1
GLOVE BIOGEL PI INDICATOR 8 (GLOVE) ×1
GLOVE ECLIPSE 7.0 STRL STRAW (GLOVE) ×2 IMPLANT
GLOVE SURG ORTHO 8.0 STRL STRW (GLOVE) ×2 IMPLANT
GOWN STRL REUS W/ TWL LRG LVL3 (GOWN DISPOSABLE) ×2 IMPLANT
GOWN STRL REUS W/ TWL XL LVL3 (GOWN DISPOSABLE) ×1 IMPLANT
GOWN STRL REUS W/TWL LRG LVL3 (GOWN DISPOSABLE) ×2
GOWN STRL REUS W/TWL XL LVL3 (GOWN DISPOSABLE) ×1
KIT BASIN OR (CUSTOM PROCEDURE TRAY) ×2 IMPLANT
KIT ROOM TURNOVER OR (KITS) ×2 IMPLANT
MANIFOLD NEPTUNE II (INSTRUMENTS) IMPLANT
NEEDLE 18GX1X1/2 (RX/OR ONLY) (NEEDLE) IMPLANT
NEEDLE HYPO 25GX1X1/2 BEV (NEEDLE) ×2 IMPLANT
NEEDLE SUT 2-0 SCORPION KNEE (NEEDLE) IMPLANT
NS IRRIG 1000ML POUR BTL (IV SOLUTION) ×2 IMPLANT
PACK ARTHROSCOPY DSU (CUSTOM PROCEDURE TRAY) ×2 IMPLANT
PAD ARMBOARD 7.5X6 YLW CONV (MISCELLANEOUS) ×4 IMPLANT
PADDING CAST COTTON 6X4 STRL (CAST SUPPLIES) ×2 IMPLANT
PENCIL BUTTON HOLSTER BLD 10FT (ELECTRODE) ×2 IMPLANT
SET ARTHROSCOPY TUBING (MISCELLANEOUS) ×1
SET ARTHROSCOPY TUBING LN (MISCELLANEOUS) ×1 IMPLANT
SOL PREP POV-IOD 4OZ 10% (MISCELLANEOUS) ×2 IMPLANT
SPONGE LAP 4X18 X RAY DECT (DISPOSABLE) ×2 IMPLANT
SUT ETHILON 3 0 PS 1 (SUTURE) IMPLANT
SUT FIBERWIRE 2-0 18 17.9 3/8 (SUTURE)
SUT MENISCAL KIT (KITS) IMPLANT
SUTURE FIBERWR 2-0 18 17.9 3/8 (SUTURE) IMPLANT
SYR 20ML ECCENTRIC (SYRINGE) ×2 IMPLANT
SYR CONTROL 10ML LL (SYRINGE) IMPLANT
SYR TB 1ML LUER SLIP (SYRINGE) ×2 IMPLANT
TOWEL OR 17X24 6PK STRL BLUE (TOWEL DISPOSABLE) ×2 IMPLANT
TOWEL OR 17X26 10 PK STRL BLUE (TOWEL DISPOSABLE) ×2 IMPLANT
TUBE CONNECTING 12X1/4 (SUCTIONS) ×2 IMPLANT
WAND HAND CNTRL MULTIVAC 90 (MISCELLANEOUS) IMPLANT
WATER STERILE IRR 1000ML POUR (IV SOLUTION) ×2 IMPLANT

## 2017-04-25 NOTE — Anesthesia Procedure Notes (Signed)
Procedure Name: LMA Insertion Date/Time: 04/25/2017 7:44 AM Performed by: Army FossaPulliam, Chayton Murata Dane, CRNA Pre-anesthesia Checklist: Patient identified, Emergency Drugs available, Suction available and Patient being monitored Patient Re-evaluated:Patient Re-evaluated prior to induction Oxygen Delivery Method: Circle System Utilized Preoxygenation: Pre-oxygenation with 100% oxygen Induction Type: IV induction Ventilation: Mask ventilation without difficulty LMA: LMA inserted LMA Size: 4.0 Number of attempts: 1 Airway Equipment and Method: Bite block Placement Confirmation: positive ETCO2 Tube secured with: Tape Dental Injury: Teeth and Oropharynx as per pre-operative assessment

## 2017-04-25 NOTE — Brief Op Note (Signed)
04/25/2017  8:46 AM  PATIENT:  Brandy Duncan  21 y.o. female  PRE-OPERATIVE DIAGNOSIS:  left knee lateral meniscal tear  POST-OPERATIVE DIAGNOSIS:  left knee lateral meniscal tear  PROCEDURE:  Procedure(s): LEFT KNEE ARTHROSCOPY WITH PARTIAL LATERAL MENISCECTOMY, removal of loose body  SURGEON:  Surgeon(s): August Saucerean, Corrie MckusickGregory Scott, MD  ASSISTANT: justin queen rnfa  ANESTHESIA:   general  EBL: 10 ml    Total I/O In: 1000 [I.V.:1000] Out: 10 [Blood:10]  BLOOD ADMINISTERED: none  DRAINS: none   LOCAL MEDICATIONS USED:  Marcaine mso4 clonidine  SPECIMEN:  No Specimen  COUNTS:  YES  TOURNIQUET:  * Missing tourniquet times found for documented tourniquets in log: [id number] *  DICTATION: .Other Dictation: Dictation Number [id number]  PLAN OF CARE: Discharge to home after PACU  PATIENT DISPOSITION:  PACU - hemodynamically stable

## 2017-04-25 NOTE — Transfer of Care (Signed)
Immediate Anesthesia Transfer of Care Note  Patient: Brandy Duncan  Procedure(s) Performed: LEFT KNEE ARTHROSCOPY WITH PARTIAL LATERAL MENISCECTOMY, POSSIBLE MENISCAL REPAIR (Left Knee)  Patient Location: PACU  Anesthesia Type:General  Level of Consciousness: drowsy and patient cooperative  Airway & Oxygen Therapy: Patient Spontanous Breathing and Patient connected to face mask oxygen  Post-op Assessment: Report given to RN and Post -op Vital signs reviewed and stable  Post vital signs: Reviewed and stable  Last Vitals:  Vitals:   04/25/17 0846 04/25/17 0847  BP:  124/63  Pulse:  (!) 107  Resp:  (!) 21  Temp: 36.7 C   SpO2:  98%    Last Pain:  Vitals:   04/25/17 0846  TempSrc: Oral  PainSc:          Complications: No apparent anesthesia complications

## 2017-04-25 NOTE — Op Note (Signed)
NAME:  Brandy Duncan, Brandy Duncan               ACCOUNT NO.:  [id number]  MEDICAL RECORD NO.:  [id number]  LOCATION:                                 FACILITY:  PHYSICIAN:  Burnard BuntingG. Scott Aidon Klemens, M.D.         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  PROCEDURE:  Left knee arthroscopy with partial lateral meniscectomy.  SURGEON:  Burnard BuntingG. Scott Elim Peale, M.D.  ASSISTANT:  Melvyn NethJustin Clean, RNFA.  INDICATIONS:  The patient is a 21 year old female with mechanical symptoms in her left knee.  She presents now for operative management after explanation of risks and benefits.  MRI scan shows tearing and discoid type lateral meniscus.  FINDINGS: 1. Examination under anesthesia, the patient had 10 degrees of     hyperextension plus full flexion, good stability to varus and     valgus stress at 0 and 30 degrees.  ACL, PCL intact. 2. Diagnostic arthroscopy: 3. Intact ACL and PCL. 4. Intact medial compartment, articular cartilage, and meniscus. 5. Intact patellofemoral compartment. 6. Tear of the lateral meniscus with meniscal free fragment in the     anterior compartment.  The patient's horizontal cleavage type     meniscal tear extended about halfway back through the meniscus     involving at most 50% of the anterior-posterior width of the     meniscus near the popliteus.  There were no corresponding     degenerative changes on the lateral femoral condyle or lateral     tibial plateau.  PROCEDURE IN DETAIL:  The patient was brought to the operating room, where general anesthetic was induced.  Preoperative antibiotics administered.  Time-out was called.  Left leg was prescrubbed with alcohol and Betadine, allowed to air dry, prepped with DuraPrep solution and draped in sterile manner.  Time-out was  called.  Anterior inferior lateral and anterior inferior medial portals were established.  Numbing medication with Marcaine and epinephrine was utilized.  The knee arthroscopy was performed.  The patient did have loose meniscal fragment within the anterior compartment, which was removed.  This free fragment measured about 1 x 1 cm.  This was removed through an enlarged medial portal.  Following that, the patellofemoral and medial compartments were inspected and found to be intact.  ACL, PCL intact.  The remaining lateral meniscus, which was discoid variant was debrided and saucerized. This gave a stable appearing meniscal rim.  No unstable flaps or cleavage component to the tear remained.  Following meniscal debridement, thorough irrigation was performed of the joint and the portals were closed using 3-0 nylon.  Solution of Marcaine, morphine, clonidine injected into the knee for postop pain relief.  The patient tolerated the procedure well without immediate complications.  Impervious dressings placed.  Bulky wrap placed.     Burnard BuntingG. Scott Reilyn Nelson, M.D.   ______________________________ Reece AgarG. Dorene GrebeScott Nickol Collister, M.D.    GSD/MEDQ  D:  04/25/2017  T:  04/25/2017  Job:  [id number]

## 2017-04-25 NOTE — Anesthesia Preprocedure Evaluation (Signed)
Anesthesia Evaluation  Patient identified by MRN, date of birth, ID band Patient awake    Reviewed: Allergy & Precautions, NPO status , Patient's Chart, lab work & pertinent test results  Airway Mallampati: I  TM Distance: >3 FB Neck ROM: Full    Dental   Pulmonary Current Smoker,    Pulmonary exam normal        Cardiovascular Normal cardiovascular exam     Neuro/Psych Depression    GI/Hepatic GERD  Medicated and Controlled,  Endo/Other    Renal/GU      Musculoskeletal   Abdominal   Peds  Hematology   Anesthesia Other Findings   Reproductive/Obstetrics                             Anesthesia Physical Anesthesia Plan  ASA: II  Anesthesia Plan: General   Post-op Pain Management:    Induction: Intravenous  PONV Risk Score and Plan: 2 and Ondansetron and Dexamethasone  Airway Management Planned: LMA  Additional Equipment:   Intra-op Plan:   Post-operative Plan: Extubation in OR  Informed Consent: I have reviewed the patients History and Physical, chart, labs and discussed the procedure including the risks, benefits and alternatives for the proposed anesthesia with the patient or authorized representative who has indicated his/her understanding and acceptance.     Plan Discussed with: CRNA and Surgeon  Anesthesia Plan Comments:         Anesthesia Quick Evaluation

## 2017-04-25 NOTE — H&P (Signed)
Brandy Duncan is an 21 y.o. female.   Chief Complaint: left knee pain HPI: patient is a 21 year old female with left knee pain.  She reports injury with  subsequent mechanical symptoms.MRI scan shows meniscal tea rof the lateral meniscus. Collateral and cruciate ligaments intact  Past Medical History:  Diagnosis Date  . Asthma   . Depression   . GERD (gastroesophageal reflux disease)   . Headache    migraines  . Lateral meniscal tear    left knee    Past Surgical History:  Procedure Laterality Date  . NO PAST SURGERIES      Family History  Problem Relation Age of Onset  . Diabetes Father    Social History:  reports that she has been smoking cigarettes.  She has been smoking about 0.50 packs per day. she has never used smokeless tobacco. She reports that she drinks alcohol. She reports that she uses drugs. Drug: Marijuana.  Allergies:  Allergies  Allergen Reactions  . Tramadol Itching, Rash and Other (See Comments)    Affects her liver  . Tylenol [Acetaminophen] Other (See Comments)    Liver issues    Medications Prior to Admission  Medication Sig Dispense Refill  . HYDROcodone-acetaminophen (NORCO/VICODIN) 5-325 MG tablet Take 1 tablet 3 (three) times daily as needed by mouth for moderate pain.     . VENTOLIN HFA 108 (90 Base) MCG/ACT inhaler Inhale 2 puffs every 6 (six) hours as needed into the lungs for shortness of breath.    . naproxen sodium (ANAPROX) 550 MG tablet Take 1 tablet (550 mg total) by mouth 2 (two) times daily with a meal. (Patient not taking: Reported on 03/08/2017) 30 tablet 0    Results for orders placed or performed during the hospital encounter of 04/25/17 (from the past 48 hour(s))  CBC     Status: None   Collection Time: 04/25/17  6:31 AM  Result Value Ref Range   WBC 7.3 4.0 - 10.5 K/uL   RBC 4.14 3.87 - 5.11 MIL/uL   Hemoglobin 12.7 12.0 - 15.0 g/dL   HCT 82.937.2 56.236.0 - 13.046.0 %   MCV 89.9 78.0 - 100.0 fL   MCH 30.7 26.0 - 34.0 pg   MCHC 34.1  30.0 - 36.0 g/dL   RDW 86.512.4 78.411.5 - 69.615.5 %   Platelets 262 150 - 400 K/uL  hCG, serum, qualitative     Status: None   Collection Time: 04/25/17  6:31 AM  Result Value Ref Range   Preg, Serum NEGATIVE NEGATIVE    Comment:        THE SENSITIVITY OF THIS METHODOLOGY IS >10 mIU/mL.    No results found.  Review of Systems  Musculoskeletal: Positive for joint pain.  All other systems reviewed and are negative.   Blood pressure 126/63, pulse 71, temperature 98.5 F (36.9 C), temperature source Oral, resp. rate 20, height 5\' 6"  (1.676 m), weight 180 lb (81.6 kg), last menstrual period 02/14/2017, SpO2 100 %. Physical Exam  Constitutional: She appears well-developed.  HENT:  Head: Normocephalic.  Eyes: Pupils are equal, round, and reactive to light.  Neck: Normal range of motion.  Cardiovascular: Normal rate.  Neurological: She is alert.  Skin: Skin is warm.  Psychiatric: She has a normal mood and affect.   examination of the left knee demonstrates full range of motion lateral joint line tenderness stable collateral and cruciate ligaments intact extensor mechanism good ankle dorsiflexion and plantarflexion strength  Assessment/Plan Iimpression is left knee discoid lateral meniscus.plan  is arthroscopy and debridement and possible meniscal repair risks and benefits are discussed.  All questions answered. Burnard BuntingG Scott Dean, MD 04/25/2017, 7:32 AM

## 2017-04-25 NOTE — Anesthesia Postprocedure Evaluation (Signed)
Anesthesia Post Note  Patient: Belinda Blockaliyah Kaspar  Procedure(s) Performed: LEFT KNEE ARTHROSCOPY WITH PARTIAL LATERAL MENISCECTOMY, POSSIBLE MENISCAL REPAIR (Left Knee)     Patient location during evaluation: PACU Anesthesia Type: General Level of consciousness: awake and alert Pain management: pain level controlled Vital Signs Assessment: post-procedure vital signs reviewed and stable Respiratory status: spontaneous breathing, nonlabored ventilation, respiratory function stable and patient connected to nasal cannula oxygen Cardiovascular status: blood pressure returned to baseline and stable Postop Assessment: no apparent nausea or vomiting Anesthetic complications: no    Last Vitals:  Vitals:   04/25/17 0902 04/25/17 0928  BP: 125/66   Pulse: 78   Resp: 12   Temp:  36.5 C  SpO2: 100%     Last Pain:  Vitals:   04/25/17 0917  TempSrc:   PainSc: Asleep                 Aradia Estey DAVID

## 2017-04-25 NOTE — Progress Notes (Signed)
Orthopedic Tech Progress Note Patient Details:  Brandy Duncan 1996-04-22 [id number]  Ortho Devices Type of Ortho Device: Crutches Ortho Device/Splint Interventions: Adjustment   [name] 04/25/2017, 9:46 AM

## 2017-04-26 ENCOUNTER — Encounter (HOSPITAL_COMMUNITY): Payer: Self-pay | Admitting: Orthopedic Surgery

## 2017-05-12 ENCOUNTER — Ambulatory Visit (INDEPENDENT_AMBULATORY_CARE_PROVIDER_SITE_OTHER): Payer: Medicare Other | Admitting: Orthopedic Surgery

## 2017-05-12 DIAGNOSIS — S83282D Other tear of lateral meniscus, current injury, left knee, subsequent encounter: Secondary | ICD-10-CM

## 2017-05-15 ENCOUNTER — Encounter (INDEPENDENT_AMBULATORY_CARE_PROVIDER_SITE_OTHER): Payer: Self-pay | Admitting: Orthopedic Surgery

## 2017-05-15 NOTE — Progress Notes (Signed)
   Post-Op Visit Note   Patient: Brandy Duncan           Date of Birth: May 27, 1996           MRN: [id number] Visit Date: 05/12/2017 PCP: Patient, No Pcp Per   Assessment & Plan:  Chief Complaint:  Chief Complaint  Patient presents with  . Left Knee - Follow-up   Visit Diagnoses:  1. Tear of lateral meniscus of left knee, unspecified tear type, unspecified whether old or current tear, subsequent encounter     Plan: Patient presents 2 weeks after left knee arthroscopy and partial posterior lateral meniscectomy for discoid meniscus.  She is doing well.  She wants to return to work.  On exam no effusion and good range of motion.  No real joint line tenderness.  Incisions intact.  Patient states that he is doing well.  I will see her back as needed R.  Need for nonweightbearing quad strengthening exercises for the next 2 months encouraged  Follow-Up Instructions: Return if symptoms worsen or fail to improve.   Orders:  No orders of the defined types were placed in this encounter.  No orders of the defined types were placed in this encounter.   Imaging: No results found.  PMFS History: Patient Active Problem List   Diagnosis Date Noted  . Acute pain of left knee 01/26/2017   Past Medical History:  Diagnosis Date  . Asthma   . Depression   . GERD (gastroesophageal reflux disease)   . Headache    migraines  . Lateral meniscal tear    left knee    Family History  Problem Relation Age of Onset  . Diabetes Father     Past Surgical History:  Procedure Laterality Date  . KNEE ARTHROSCOPY WITH MENISCAL REPAIR Left 04/25/2017   Procedure: LEFT KNEE ARTHROSCOPY WITH PARTIAL LATERAL MENISCECTOMY, POSSIBLE MENISCAL REPAIR;  Surgeon: Cammy Copaean, Preesha Benjamin Scott, MD;  Location: [facility] OR;  Service: Orthopedics;  Laterality: Left;  . NO PAST SURGERIES     Social History   Occupational History  . Not on file  Tobacco Use  . Smoking status: Current Every Day Smoker    Packs/day: 0.50   Types: Cigarettes  . Smokeless tobacco: Never Used  Substance and Sexual Activity  . Alcohol use: Yes    Comment: occasionally  . Drug use: Yes    Types: Marijuana  . Sexual activity: Not on file

## 2017-05-24 DIAGNOSIS — E559 Vitamin D deficiency, unspecified: Secondary | ICD-10-CM | POA: Diagnosis not present

## 2017-05-24 DIAGNOSIS — Z130 Encounter for screening for diseases of the blood and blood-forming organs and certain disorders involving the immune mechanism: Secondary | ICD-10-CM | POA: Diagnosis not present

## 2017-05-24 DIAGNOSIS — M25562 Pain in left knee: Secondary | ICD-10-CM | POA: Diagnosis not present

## 2017-09-06 DIAGNOSIS — N6314 Unspecified lump in the right breast, lower inner quadrant: Secondary | ICD-10-CM | POA: Diagnosis not present

## 2017-09-06 DIAGNOSIS — R070 Pain in throat: Secondary | ICD-10-CM | POA: Diagnosis not present

## 2017-09-06 DIAGNOSIS — Z124 Encounter for screening for malignant neoplasm of cervix: Secondary | ICD-10-CM | POA: Diagnosis not present

## 2017-09-06 DIAGNOSIS — N6324 Unspecified lump in the left breast, lower inner quadrant: Secondary | ICD-10-CM | POA: Diagnosis not present

## 2017-09-06 DIAGNOSIS — N760 Acute vaginitis: Secondary | ICD-10-CM | POA: Diagnosis not present

## 2017-09-06 DIAGNOSIS — Z01419 Encounter for gynecological examination (general) (routine) without abnormal findings: Secondary | ICD-10-CM | POA: Diagnosis not present

## 2017-09-06 DIAGNOSIS — J019 Acute sinusitis, unspecified: Secondary | ICD-10-CM | POA: Diagnosis not present

## 2017-09-07 DIAGNOSIS — N6314 Unspecified lump in the right breast, lower inner quadrant: Secondary | ICD-10-CM | POA: Diagnosis not present

## 2017-09-07 DIAGNOSIS — N6324 Unspecified lump in the left breast, lower inner quadrant: Secondary | ICD-10-CM | POA: Diagnosis not present

## 2017-09-07 DIAGNOSIS — N6489 Other specified disorders of breast: Secondary | ICD-10-CM | POA: Diagnosis not present

## 2017-09-13 DIAGNOSIS — Z130 Encounter for screening for diseases of the blood and blood-forming organs and certain disorders involving the immune mechanism: Secondary | ICD-10-CM | POA: Diagnosis not present

## 2017-09-13 DIAGNOSIS — E559 Vitamin D deficiency, unspecified: Secondary | ICD-10-CM | POA: Diagnosis not present

## 2017-09-13 DIAGNOSIS — J302 Other seasonal allergic rhinitis: Secondary | ICD-10-CM | POA: Diagnosis not present

## 2017-09-13 DIAGNOSIS — E663 Overweight: Secondary | ICD-10-CM | POA: Diagnosis not present

## 2017-09-13 DIAGNOSIS — E8881 Metabolic syndrome: Secondary | ICD-10-CM | POA: Diagnosis not present

## 2020-07-14 ENCOUNTER — Ambulatory Visit (INDEPENDENT_AMBULATORY_CARE_PROVIDER_SITE_OTHER): Payer: Medicare Other

## 2020-07-14 ENCOUNTER — Other Ambulatory Visit: Payer: Self-pay

## 2020-07-14 ENCOUNTER — Ambulatory Visit (INDEPENDENT_AMBULATORY_CARE_PROVIDER_SITE_OTHER): Payer: Medicare Other | Admitting: Family Medicine

## 2020-07-14 DIAGNOSIS — M25561 Pain in right knee: Secondary | ICD-10-CM

## 2020-07-14 MED ORDER — MELOXICAM 15 MG PO TABS: 7.5000 mg | ORAL_TABLET | Freq: Every day | ORAL | 6 refills | Status: DC | PRN

## 2020-07-14 MED ORDER — TRAMADOL HCL 50 MG PO TABS: 50.0000 mg | ORAL_TABLET | Freq: Four times a day (QID) | ORAL | 0 refills | Status: DC | PRN

## 2020-07-14 NOTE — Progress Notes (Signed)
Office Visit Note   Patient: Brandy Duncan           Date of Birth: 08-23-1995           MRN: [id number] Visit Date: 07/14/2020 Requested by: No referring provider defined for this encounter. PCP: Patient, No Pcp Per  Subjective: Chief Complaint  Patient presents with  . Right Knee - Pain    Slipped and fell while going down steps 2 nights ago. The knee twisted and popped. She tried getting up to walk again and the knee twisted and popped again several more times. Hurts to put pressure on the leg. Pain with flexion. If she straightens the leg fully, she feels tingling in the knee - lateral and anterior, superior to the patella. Wearing knee sleeve.    HPI: 25yo F presenting to clinic with concerns of acute right knee pain, swelling after slipping on ice and twisting her leg beneath her two days ago. Patient has a history of a meniscal injury in her left knee, requiring surgery approx 2 years ago, and is worried that this feels very similar. She says that after her initial fall, the knee slowly began to swell on her, and was very swollen by the following morning. She has been having her mother help her around the house, and has tried a knee compression wrap as well as ice, with little benefit. She works at [employer] and is on her feet throughout her shifts, but denies any regular exercise. States she feels as though her left knee never fully recovered from her previous meniscal injury, and this stops her from exertion. She is worried she will need surgery again.               ROS:   All other systems were reviewed and are negative.  Objective: Vital Signs: There were no vitals taken for this visit.  Physical Exam:  General:  Alert and oriented, in no acute distress. Pulm:  Breathing unlabored. Psy:  Normal mood, congruent affect. Skin:  Right knee with no obvious bruising, no rashes, no erythema. Overlying skin intact.   Right knee exam:  General: Antalgic gait, favoring right  leg Standing exam: Does not tolerate.  Seated Exam:  No patellar crepitus, Negative J-Sign.   Palpation: Endorses tenderness over lateral joint line. No significant medial joint line tenderness. No pain with patellar compression or at the patellar tendon.   Supine exam: Moderate effusion, normal patellar mobility.   Ligamentous Exam:  Endorses pain, and does display laxity with anterior/posterior drawer.  No obvious Sag.   Endorses significant pain with varus stress across the knee, with approx 1cm gaping, though does have strong endpoint.    Meniscus:  Guards Advertising account executive  Strength: Hip flexion (L1), Hip Aduction (L2), Knee Extension (L3) are 5/5 Bilaterally Foot Inversion (L4), Dorsiflexion (L5), and Eversion (S1) 5/5 Bilaterally  Sensation: Intact to light touch medial and lateral aspects of lower extremities, and lateral, dorsal, and medial aspects of foot, save for most distal aspect of right greater toe.    Imaging: XR Knee 1-2 Views Right  Result Date: 07/14/2020 X-Rays show early degenerative spurring in the lateral compartment.  Normal anatomic alignment, no acute fracture seen.   Assessment & Plan: 25yo F presenting to clinic with acute knee swelling, pain after slipping on ice and sustaining a twisting injury. Guards Advertising account executive, so unable to fully determine meniscal integrity, though does seem to have some laxity with anterior drawer.  - Will order MRI  for better evaluation - F/U pending MRI results.      Procedures: No procedures performed        PMFS History: Patient Active Problem List   Diagnosis Date Noted  . Acute pain of left knee 01/26/2017   Past Medical History:  Diagnosis Date  . Asthma   . Depression   . GERD (gastroesophageal reflux disease)   . Headache    migraines  . Lateral meniscal tear    left knee    Family History  Problem Relation Age of Onset  . Diabetes Father     Past Surgical History:  Procedure Laterality  Date  . KNEE ARTHROSCOPY WITH MENISCAL REPAIR Left 04/25/2017   Procedure: LEFT KNEE ARTHROSCOPY WITH PARTIAL LATERAL MENISCECTOMY, POSSIBLE MENISCAL REPAIR;  Surgeon: Cammy Copa, MD;  Location: [facility] OR;  Service: Orthopedics;  Laterality: Left;  . NO PAST SURGERIES     Social History   Occupational History  . Not on file  Tobacco Use  . Smoking status: Current Every Day Smoker    Packs/day: 0.50    Types: Cigarettes  . Smokeless tobacco: Never Used  Vaping Use  . Vaping Use: Never used  Substance and Sexual Activity  . Alcohol use: Yes    Comment: occasionally  . Drug use: Yes    Types: Marijuana  . Sexual activity: Not on file

## 2020-07-14 NOTE — Progress Notes (Signed)
I saw and examined the patient with Dr. Marga Hoots and agree with assessment and plan as outlined.    Right knee pain, swelling, instability s/p fall.  Concerning for LMT or ACL tear.    Will treat with meloxicam; proceed with MRI.

## 2020-07-21 ENCOUNTER — Encounter: Payer: Self-pay | Admitting: Family Medicine

## 2020-07-23 ENCOUNTER — Telehealth: Payer: Self-pay | Admitting: Family Medicine

## 2020-07-23 NOTE — Telephone Encounter (Signed)
MRI confirms ACL tear.  Please schedule consult with SD to discuss repair.

## 2020-07-23 NOTE — Telephone Encounter (Signed)
I called - the patient is scheduled for 2/23 at 8:45 with Dr. August Saucer. The patient's mom asked if there are any cancellations that come up, could the patient be called to come in sooner.  I told her I would send that request on to Dr. Diamantina Providence assistant.

## 2020-07-24 NOTE — Telephone Encounter (Signed)
Noted. Will keep lookout for cancellation.

## 2020-08-06 ENCOUNTER — Ambulatory Visit (INDEPENDENT_AMBULATORY_CARE_PROVIDER_SITE_OTHER): Payer: Medicare Other | Admitting: Orthopedic Surgery

## 2020-08-06 DIAGNOSIS — S83511A Sprain of anterior cruciate ligament of right knee, initial encounter: Secondary | ICD-10-CM | POA: Diagnosis not present

## 2020-08-06 MED ORDER — MELOXICAM 15 MG PO TABS: 7.5000 mg | ORAL_TABLET | Freq: Every day | ORAL | 6 refills | Status: DC | PRN

## 2020-08-13 ENCOUNTER — Encounter: Payer: Self-pay | Admitting: Orthopedic Surgery

## 2020-08-13 NOTE — Progress Notes (Signed)
Office Visit Note   Patient: Brandy Duncan           Date of Birth: Sep 29, 1995           MRN: [id number] Visit Date: 08/06/2020 Requested by: No referring provider defined for this encounter. PCP: Patient, No Pcp Per  Subjective: Chief Complaint  Patient presents with  . Right Knee - Injury    HPI: Patient presents for evaluation of right knee pain.  She fell about 3 weeks ago.  Previously had fallen several times.  States that her knee went sideways.  She has a history of left knee arthroscopy with partial meniscectomy.  Injury occurred when she fell down the steps because a brick was loose at home.  The knee did swell up at that time.  She states that her knee feels "wobbly".  She has been working at [employer].  No personal or family history of DVT or pulmonary embolism.  She does use a brace.  She does not do sports.              ROS: All systems reviewed are negative as they relate to the chief complaint within the history of present illness.  Patient denies  fevers or chills.   Assessment & Plan: Visit Diagnoses:  1. Rupture of anterior cruciate ligament of right knee, initial encounter     Plan: Impression is right knee pain with ACL deficiency.  MRI scan performed does show ACL tear with no definite meniscal pathology present.  Because of her instability we discussed operative and nonoperative treatment options.  She is having daily symptomatic instability with the knee.  Plan at this time is ACL reconstruction using autograft.  Risks and benefits of the procedure discussed with the patient including but not limited to infection nerve vessel damage knee stiffness as well as the intensive rehabilitative effort required.  Would plan to use either quadriceps tendon autograft or hamstring autograft with possible extra-articular augmentation.  Patient understands the risk and benefits.  All questions answered.  Plan for surgery sometime in the near future.  Follow-Up Instructions: No  follow-ups on file.   Orders:  No orders of the defined types were placed in this encounter.  Meds ordered this encounter  Medications  . meloxicam (MOBIC) 15 MG tablet    Sig: Take 0.5-1 tablets (7.5-15 mg total) by mouth daily as needed for pain.    Dispense:  30 tablet    Refill:  6      Procedures: No procedures performed   Clinical Data: No additional findings.  Objective: Vital Signs: There were no vitals taken for this visit.  Physical Exam:   Constitutional: Patient appears well-developed HEENT:  Head: Normocephalic Eyes:EOM are normal Neck: Normal range of motion Cardiovascular: Normal rate Pulmonary/chest: Effort normal Neurologic: Patient is alert Skin: Skin is warm Psychiatric: Patient has normal mood and affect    Ortho Exam: Ortho exam demonstrates full active and passive range of motion of the left knee.  Right knee has mild effusion but flexion easily to 120.  Collaterals are stable to varus valgus stress at 0 and 30 degrees.  Extensor mechanism intact.  Ankle dorsiflexion intact with palpable pedal pulses.  No posterior lateral rotatory instability noted.  PCL intact.  ACL laxity is present with positive Lachman and positive anterior drawer.  No joint line tenderness is present.  McMurray compression testing negative.  Specialty Comments:  No specialty comments available.  Imaging: No results found.   PMFS History: Patient  Active Problem List   Diagnosis Date Noted  . Acute pain of left knee 01/26/2017   Past Medical History:  Diagnosis Date  . Asthma   . Depression   . GERD (gastroesophageal reflux disease)   . Headache    migraines  . Lateral meniscal tear    left knee    Family History  Problem Relation Age of Onset  . Diabetes Father     Past Surgical History:  Procedure Laterality Date  . KNEE ARTHROSCOPY WITH MENISCAL REPAIR Left 04/25/2017   Procedure: LEFT KNEE ARTHROSCOPY WITH PARTIAL LATERAL MENISCECTOMY, POSSIBLE  MENISCAL REPAIR;  Surgeon: Cammy Copa, MD;  Location: [facility] OR;  Service: Orthopedics;  Laterality: Left;  . NO PAST SURGERIES     Social History   Occupational History  . Not on file  Tobacco Use  . Smoking status: Current Every Day Smoker    Packs/day: 0.50    Types: Cigarettes  . Smokeless tobacco: Never Used  Vaping Use  . Vaping Use: Never used  Substance and Sexual Activity  . Alcohol use: Yes    Comment: occasionally  . Drug use: Yes    Types: Marijuana  . Sexual activity: Not on file

## 2020-08-21 ENCOUNTER — Other Ambulatory Visit: Payer: Self-pay

## 2020-09-01 ENCOUNTER — Encounter: Payer: Self-pay | Admitting: Orthopedic Surgery

## 2020-09-01 ENCOUNTER — Ambulatory Visit: Admit: 2020-09-01 | Payer: Medicare Other | Admitting: Orthopedic Surgery

## 2020-09-01 ENCOUNTER — Other Ambulatory Visit: Payer: Self-pay | Admitting: Surgical

## 2020-09-01 DIAGNOSIS — S83511D Sprain of anterior cruciate ligament of right knee, subsequent encounter: Secondary | ICD-10-CM

## 2020-09-01 DIAGNOSIS — S83231D Complex tear of medial meniscus, current injury, right knee, subsequent encounter: Secondary | ICD-10-CM | POA: Diagnosis not present

## 2020-09-01 SURGERY — RECONSTRUCTION, KNEE, ACL
Anesthesia: General | Site: Knee | Laterality: Right

## 2020-09-01 MED ORDER — KETOROLAC TROMETHAMINE 10 MG PO TABS: 10.0000 mg | ORAL_TABLET | Freq: Three times a day (TID) | ORAL | 0 refills | Status: AC | PRN

## 2020-09-01 MED ORDER — OXYCODONE HCL 5 MG PO TABS: 5.0000 mg | ORAL_TABLET | ORAL | 0 refills | Status: DC | PRN

## 2020-09-01 MED ORDER — ASPIRIN 81 MG PO CHEW: 81.0000 mg | CHEWABLE_TABLET | Freq: Every day | ORAL | 0 refills | Status: AC

## 2020-09-01 MED ORDER — METHOCARBAMOL 500 MG PO TABS: 500.0000 mg | ORAL_TABLET | Freq: Three times a day (TID) | ORAL | 0 refills | Status: DC | PRN

## 2020-09-04 ENCOUNTER — Telehealth: Payer: Self-pay

## 2020-09-04 NOTE — Telephone Encounter (Signed)
Patient's mother called stating that patient needs a note/letter faxed to her lawyer stating that she is not able to walk due to having surgery.  Fax# [phone].  Cb# [phone].  Please advise.  Thank you

## 2020-09-08 ENCOUNTER — Other Ambulatory Visit: Payer: Self-pay

## 2020-09-08 ENCOUNTER — Ambulatory Visit (INDEPENDENT_AMBULATORY_CARE_PROVIDER_SITE_OTHER): Payer: Medicare Other | Admitting: Orthopedic Surgery

## 2020-09-08 DIAGNOSIS — S83511A Sprain of anterior cruciate ligament of right knee, initial encounter: Secondary | ICD-10-CM

## 2020-09-08 NOTE — Telephone Encounter (Signed)
done

## 2020-09-09 ENCOUNTER — Telehealth: Payer: Self-pay

## 2020-09-09 ENCOUNTER — Other Ambulatory Visit: Payer: Self-pay | Admitting: Surgical

## 2020-09-09 MED ORDER — OXYCODONE HCL 5 MG PO TABS: 5.0000 mg | ORAL_TABLET | Freq: Four times a day (QID) | ORAL | 0 refills | Status: DC | PRN

## 2020-09-09 MED ORDER — METHOCARBAMOL 500 MG PO TABS: 500.0000 mg | ORAL_TABLET | Freq: Three times a day (TID) | ORAL | 0 refills | Status: DC | PRN

## 2020-09-09 NOTE — Telephone Encounter (Signed)
Advised submitted.  

## 2020-09-09 NOTE — Telephone Encounter (Signed)
Please advise 

## 2020-09-09 NOTE — Telephone Encounter (Signed)
Sent in refill

## 2020-09-09 NOTE — Telephone Encounter (Signed)
Pt called and would like a refill on all her medication.  She said her knee is really bothering her and she has been out of medication since Saturday.

## 2020-09-11 ENCOUNTER — Encounter: Payer: Self-pay | Admitting: Orthopedic Surgery

## 2020-09-11 NOTE — Progress Notes (Signed)
   Post-Op Visit Note   Patient: Brandy Duncan           Date of Birth: Dec 23, 1995           MRN: [id number] Visit Date: 09/08/2020 PCP: Patient, No Pcp Per (Inactive)   Assessment & Plan:  Chief Complaint:  Chief Complaint  Patient presents with  . Right Knee - Routine Post Op   Visit Diagnoses:  1. Rupture of anterior cruciate ligament of right knee, initial encounter     Plan: Patient is a 25 year old female who presents s/p right knee ACL reconstruction with quadricep autograft and medial meniscectomy with arthroscopy on 09/01/2020.  She complains of moderate to severe pain and has been ambulating nonweightbearing with walker.  She is using CPM machine and she is up to 60 degrees.  She starts that 20 degrees and works up to 60 degrees.  She is taking aspirin daily.  She is out of pain medication since Saturday and requests refill.  On exam her incisions are healing well without any evidence of infection or dehiscence.  She has 0 degrees of extension and about 50 degrees of knee flexion.  She is not able to perform any straight leg raises but her quad is firing.  ACL is stable on Lachman exam and by anterior drawer.  No calf tenderness.  Negative Homans' sign.  Plan to have patient continue working on her knee range of motion and try and progress her weightbearing status as she can tolerate.  She must ambulate with knee immobilizer when she is up as she has very little quad strength at this point.  Once she can do 10-15 straight leg raises in a row she may discontinue the knee immobilizer.  Follow-up in 2 weeks for clinical recheck.  Follow-Up Instructions: No follow-ups on file.   Orders:  No orders of the defined types were placed in this encounter.  No orders of the defined types were placed in this encounter.   Imaging: No results found.  PMFS History: Patient Active Problem List   Diagnosis Date Noted  . Acute pain of left knee 01/26/2017   Past Medical History:   Diagnosis Date  . Asthma   . Depression   . GERD (gastroesophageal reflux disease)   . Headache    migraines  . Lateral meniscal tear    left knee    Family History  Problem Relation Age of Onset  . Diabetes Father     Past Surgical History:  Procedure Laterality Date  . KNEE ARTHROSCOPY WITH MENISCAL REPAIR Left 04/25/2017   Procedure: LEFT KNEE ARTHROSCOPY WITH PARTIAL LATERAL MENISCECTOMY, POSSIBLE MENISCAL REPAIR;  Surgeon: Cammy Copa, MD;  Location: [facility] OR;  Service: Orthopedics;  Laterality: Left;  . NO PAST SURGERIES     Social History   Occupational History  . Not on file  Tobacco Use  . Smoking status: Current Every Day Smoker    Packs/day: 0.50    Types: Cigarettes  . Smokeless tobacco: Never Used  Vaping Use  . Vaping Use: Never used  Substance and Sexual Activity  . Alcohol use: Yes    Comment: occasionally  . Drug use: Yes    Types: Marijuana  . Sexual activity: Not on file

## 2020-09-15 ENCOUNTER — Telehealth: Payer: Self-pay | Admitting: Orthopedic Surgery

## 2020-09-15 NOTE — Telephone Encounter (Signed)
Appt scheduled

## 2020-09-15 NOTE — Telephone Encounter (Signed)
Pt mom called and would like to know if you can squeeze her daughter in because she said she is suppose to be seen every week. Can you do the 12th Tuesday in the morning? CB [phone]

## 2020-09-16 ENCOUNTER — Telehealth: Payer: Self-pay

## 2020-09-16 NOTE — Telephone Encounter (Signed)
Patient called stating that she has been have an aching pain since last night.  Stated that her right leg is not straight. Right leg is hard, muscles are tight, and thinks that something may be wrong with her CPM machine.  Would like a Rx refill on Oxycodone and Methocarbamol and will give the company a call about the CPM machine. Right knee surgery on 09/01/2020.  CB# [phone].  Please advise.  Thank you.

## 2020-09-16 NOTE — Telephone Encounter (Signed)
FYI  Pt called stating that she has tried calling regarding her CPM machine with no answer.  She would like to come in asap. She said that she can just feel that something is wrong with her knee.

## 2020-09-16 NOTE — Telephone Encounter (Signed)
See below. Please advise.  

## 2020-09-17 ENCOUNTER — Other Ambulatory Visit: Payer: Self-pay | Admitting: Surgical

## 2020-09-17 ENCOUNTER — Telehealth: Payer: Self-pay | Admitting: Orthopedic Surgery

## 2020-09-17 MED ORDER — OXYCODONE HCL 5 MG PO TABS: 5.0000 mg | ORAL_TABLET | Freq: Three times a day (TID) | ORAL | 0 refills | Status: DC | PRN

## 2020-09-17 NOTE — Telephone Encounter (Signed)
Sent in

## 2020-09-17 NOTE — Telephone Encounter (Signed)
Okay by me.

## 2020-09-17 NOTE — Telephone Encounter (Signed)
Pt called again and needs her oxycodone...she has been out of pain medication for 2 days and is stating she is in a lot of pain.

## 2020-09-17 NOTE — Telephone Encounter (Signed)
Please advise 

## 2020-09-18 NOTE — Telephone Encounter (Signed)
Tried calling to schedule work in appt. No answer.

## 2020-09-18 NOTE — Telephone Encounter (Signed)
I called and advised. 

## 2020-09-19 ENCOUNTER — Telehealth: Payer: Self-pay | Admitting: Orthopedic Surgery

## 2020-09-19 MED ORDER — METHOCARBAMOL 500 MG PO TABS: 500.0000 mg | ORAL_TABLET | Freq: Three times a day (TID) | ORAL | 0 refills | Status: DC | PRN

## 2020-09-19 NOTE — Telephone Encounter (Signed)
thx

## 2020-09-19 NOTE — Telephone Encounter (Signed)
See below. I advised we were not aware that needed refill on MR. I sent in refill.

## 2020-09-19 NOTE — Telephone Encounter (Signed)
Patient called. She would like to know why her muscle relaxer did not get refilled. Her call back number is [phone]

## 2020-09-24 ENCOUNTER — Telehealth: Payer: Self-pay

## 2020-09-24 ENCOUNTER — Ambulatory Visit: Payer: Medicare Other | Admitting: Orthopedic Surgery

## 2020-09-24 NOTE — Telephone Encounter (Signed)
Patient called she missed he appointment this morning, she had surgery 3/21 she is requesting to be worked into Dr.Deans schedule asap if possible call back:[phone]

## 2020-09-24 NOTE — Telephone Encounter (Signed)
Tried calling to schedule appt. No answer. Unable to LM. VM is full.

## 2020-09-29 ENCOUNTER — Other Ambulatory Visit (HOSPITAL_COMMUNITY): Payer: Self-pay | Admitting: Surgical

## 2020-09-29 ENCOUNTER — Telehealth: Payer: Self-pay | Admitting: Orthopedic Surgery

## 2020-09-29 ENCOUNTER — Ambulatory Visit: Payer: Medicare Other | Admitting: Orthopedic Surgery

## 2020-09-29 ENCOUNTER — Telehealth: Payer: Self-pay

## 2020-09-29 MED ORDER — OXYCODONE HCL 5 MG PO TABS: 5.0000 mg | ORAL_TABLET | Freq: Two times a day (BID) | ORAL | 0 refills | Status: DC | PRN

## 2020-09-29 MED ORDER — METHOCARBAMOL 500 MG PO TABS: 500.0000 mg | ORAL_TABLET | Freq: Three times a day (TID) | ORAL | 0 refills | Status: DC | PRN

## 2020-09-29 NOTE — Telephone Encounter (Signed)
Pt called asking to have her PT referral faxed to [facility] so they can go ahead and schedule her? Pt also asked to have her muscle relaxer and pain rx refilled; she would like to be notified when this has been done.  [phone]

## 2020-09-29 NOTE — Telephone Encounter (Signed)
scheduled

## 2020-09-29 NOTE — Telephone Encounter (Signed)
Meds sent. Faxed rx for PT to [phone]

## 2020-09-29 NOTE — Telephone Encounter (Signed)
Please advise 

## 2020-09-29 NOTE — Telephone Encounter (Signed)
Noted  

## 2020-09-29 NOTE — Telephone Encounter (Signed)
Patient called back with correct fax number to physical therapy. Fax number is [phone].

## 2020-09-29 NOTE — Telephone Encounter (Signed)
Okay for this I will write a RX to fax this afternoon, please remind me.  Will refill meds

## 2020-10-01 ENCOUNTER — Telehealth: Payer: Self-pay | Admitting: *Deleted

## 2020-10-01 NOTE — Telephone Encounter (Signed)
isha with [facility] stating they only rec'd the Rx for PT but needs demo and reason. Please fax to [phone]

## 2020-10-01 NOTE — Telephone Encounter (Signed)
faxed

## 2020-10-07 ENCOUNTER — Other Ambulatory Visit: Payer: Self-pay | Admitting: Surgical

## 2020-10-07 ENCOUNTER — Telehealth: Payer: Self-pay | Admitting: Orthopedic Surgery

## 2020-10-07 MED ORDER — METHOCARBAMOL 500 MG PO TABS: 500.0000 mg | ORAL_TABLET | Freq: Three times a day (TID) | ORAL | 0 refills | Status: DC | PRN

## 2020-10-07 MED ORDER — OXYCODONE HCL 5 MG PO TABS: 5.0000 mg | ORAL_TABLET | Freq: Two times a day (BID) | ORAL | 0 refills | Status: DC | PRN

## 2020-10-07 NOTE — Telephone Encounter (Signed)
Sent in RX, should last 10 days if taking as directed

## 2020-10-07 NOTE — Telephone Encounter (Signed)
See below. Please advise. Thanks.  

## 2020-10-07 NOTE — Telephone Encounter (Signed)
Pt called stating she needs a refill of her oxycodone rx as well as her muscle relaxers. Pt asks for a CB as soon as these have been sent in.  [phone]

## 2020-10-08 NOTE — Telephone Encounter (Signed)
IC advised.  

## 2020-10-10 ENCOUNTER — Telehealth: Payer: Self-pay

## 2020-10-10 ENCOUNTER — Telehealth: Payer: Self-pay | Admitting: Orthopedic Surgery

## 2020-10-10 ENCOUNTER — Ambulatory Visit (INDEPENDENT_AMBULATORY_CARE_PROVIDER_SITE_OTHER): Payer: Medicare Other | Admitting: Orthopedic Surgery

## 2020-10-10 DIAGNOSIS — S83511A Sprain of anterior cruciate ligament of right knee, initial encounter: Secondary | ICD-10-CM

## 2020-10-10 MED ORDER — METHOCARBAMOL 500 MG PO TABS: 500.0000 mg | ORAL_TABLET | Freq: Three times a day (TID) | ORAL | 0 refills | Status: DC | PRN

## 2020-10-10 NOTE — Telephone Encounter (Signed)
Vernona Rieger with [facility] pharmacy called stating the pt told them Dr. August Saucer said she could pick up her oxycodone early but the pharmacy never received a call or message and they need approval to release the rx. Vernona Rieger would like a CB from either a nurse or Dr. August Saucer to confirm if this is correct; if not the rx will not be released until 10/13/20.  [phone]

## 2020-10-10 NOTE — Telephone Encounter (Signed)
Patient called regarding last message she is requesting the rx to be sent ASAP due to not having any pain meds for the weekend call [phone]

## 2020-10-10 NOTE — Telephone Encounter (Signed)
Called and called patient

## 2020-10-11 ENCOUNTER — Encounter: Payer: Self-pay | Admitting: Orthopedic Surgery

## 2020-10-11 NOTE — Progress Notes (Signed)
   Post-Op Visit Note   Patient: Brandy Duncan           Date of Birth: 1995/09/12           MRN: [id number] Visit Date: 10/10/2020 PCP: Patient, No Pcp Per (Inactive)   Assessment & Plan:  Chief Complaint:  Chief Complaint  Patient presents with  . Right Knee - Routine Post Op   Visit Diagnoses:  1. Rupture of anterior cruciate ligament of right knee, initial encounter     Plan: Patient presents now about 6 weeks out right knee ACL reconstruction with quad autograft and partial medial meniscectomy.  Patient's been ambulating with a walker but reports continued pain and some weakness.  On exam she does have a weak quad.  It does fire but she cannot do a straight leg raise yet.  I Minna refill her Robaxin.  Continue working with therapy with [facility] for quad strengthening.  No calf tenderness negative Homans today.  3-week recheck just to recheck her strength.  She does achieve full extension and passively gets to about 85 degrees of flexion.  At this time would not really want to consider any type of manipulation until her quad strength improves.  Follow-Up Instructions: Return in about 3 weeks (around 10/31/2020).   Orders:  No orders of the defined types were placed in this encounter.  Meds ordered this encounter  Medications  . methocarbamol (ROBAXIN) 500 MG tablet    Sig: Take 1 tablet (500 mg total) by mouth every 8 (eight) hours as needed for muscle spasms.    Dispense:  30 tablet    Refill:  0    Imaging: No results found.  PMFS History: Patient Active Problem List   Diagnosis Date Noted  . Acute pain of left knee 01/26/2017   Past Medical History:  Diagnosis Date  . Asthma   . Depression   . GERD (gastroesophageal reflux disease)   . Headache    migraines  . Lateral meniscal tear    left knee    Family History  Problem Relation Age of Onset  . Diabetes Father     Past Surgical History:  Procedure Laterality Date  . KNEE ARTHROSCOPY WITH MENISCAL REPAIR  Left 04/25/2017   Procedure: LEFT KNEE ARTHROSCOPY WITH PARTIAL LATERAL MENISCECTOMY, POSSIBLE MENISCAL REPAIR;  Surgeon: Cammy Copa, MD;  Location: [facility] OR;  Service: Orthopedics;  Laterality: Left;  . NO PAST SURGERIES     Social History   Occupational History  . Not on file  Tobacco Use  . Smoking status: Current Every Day Smoker    Packs/day: 0.50    Types: Cigarettes  . Smokeless tobacco: Never Used  Vaping Use  . Vaping Use: Never used  Substance and Sexual Activity  . Alcohol use: Yes    Comment: occasionally  . Drug use: Yes    Types: Marijuana  . Sexual activity: Not on file

## 2020-10-13 NOTE — Telephone Encounter (Signed)
See below. Please advise.  

## 2020-10-13 NOTE — Telephone Encounter (Signed)
Took care of this on Friday, no worries

## 2020-10-23 ENCOUNTER — Other Ambulatory Visit: Payer: Self-pay | Admitting: Surgical

## 2020-10-23 ENCOUNTER — Telehealth: Payer: Self-pay | Admitting: Orthopedic Surgery

## 2020-10-23 MED ORDER — OXYCODONE HCL 5 MG PO TABS: 5.0000 mg | ORAL_TABLET | Freq: Every day | ORAL | 0 refills | Status: DC | PRN

## 2020-10-23 MED ORDER — METHOCARBAMOL 500 MG PO TABS: 500.0000 mg | ORAL_TABLET | Freq: Three times a day (TID) | ORAL | 0 refills | Status: DC | PRN

## 2020-10-23 NOTE — Telephone Encounter (Signed)
Pls advise.  

## 2020-10-23 NOTE — Telephone Encounter (Signed)
Sent in refill for robaxin and oxycoodne.  This should be close to the last refill she will get for opioid medication as she should be weaning off medicatoin by now

## 2020-10-23 NOTE — Telephone Encounter (Signed)
Patient called again and she would like a refill on Robaxin and would like some pain medication called in. Her call back number is [phone]

## 2020-10-23 NOTE — Telephone Encounter (Signed)
Patient called. She would like a refill on Robaxin and would like some pain  Medication called in. Her call back number is [phone]

## 2020-11-05 ENCOUNTER — Telehealth: Payer: Self-pay | Admitting: Orthopedic Surgery

## 2020-11-05 NOTE — Telephone Encounter (Signed)
Please advise 

## 2020-11-05 NOTE — Telephone Encounter (Signed)
Patient called requesting a refill od changed medication to hydrocodone and muscle relaxer. Please send to pharmacy on file. Patient phone number is [phone].

## 2020-11-06 ENCOUNTER — Other Ambulatory Visit: Payer: Self-pay | Admitting: Surgical

## 2020-11-06 ENCOUNTER — Telehealth: Payer: Self-pay | Admitting: Orthopedic Surgery

## 2020-11-06 MED ORDER — METHOCARBAMOL 500 MG PO TABS: 500.0000 mg | ORAL_TABLET | Freq: Three times a day (TID) | ORAL | 0 refills | Status: AC | PRN

## 2020-11-06 MED ORDER — OXYCODONE HCL 5 MG PO TABS
5.0000 mg | ORAL_TABLET | Freq: Every day | ORAL | 0 refills | Status: AC | PRN
Start: 2020-11-06 — End: 2021-11-06

## 2020-11-06 NOTE — Telephone Encounter (Signed)
Patient is requesting call from you in regards to why pain medication quantity decreased.  Please see message from [place] below.  Sent in RX for oxycodone without acetaminophen instead of Norco given her history of " liver issues".  This should be the last opioid RX she receives and then she will have to consider more OTC pain medication instead

## 2020-11-06 NOTE — Telephone Encounter (Signed)
Patient called requesting a call back from Dr. August Saucer. She is asking why her quality of pain pills went down. Please call patient at [phone].

## 2020-11-06 NOTE — Telephone Encounter (Signed)
Noted. Patient has new message in chart requesting call from Dr. August Saucer regarding quantity of medication. She requests to speak with him.  I have forwarded this message for him to review as well.

## 2020-11-06 NOTE — Telephone Encounter (Signed)
Sent in RX for oxycodone without acetaminophen instead of Norco given her history of " liver issues".  This should be the last opioid RX she receives and then she will have to consider more OTC pain medication instead

## 2020-11-11 NOTE — Telephone Encounter (Signed)
Pt called again asking why prescriptions has decreased and requesting a call. Pt states both knees are now in pain. The pharmacy is still the same as on file.  The best call back number is [phone].

## 2020-11-14 NOTE — Telephone Encounter (Signed)
I called appt monday

## 2020-11-20 ENCOUNTER — Ambulatory Visit: Payer: Medicare Other | Admitting: Orthopedic Surgery

## 2020-11-28 ENCOUNTER — Ambulatory Visit: Payer: Medicare Other | Admitting: Surgical

## 2020-12-18 ENCOUNTER — Telehealth: Payer: Self-pay | Admitting: Orthopedic Surgery

## 2020-12-18 NOTE — Telephone Encounter (Signed)
I called and lm on vm to advise of message below. To call and make an appt anytime we are happy to see her in the office or with any questions.

## 2020-12-18 NOTE — Telephone Encounter (Signed)
She needs to return to the office for evaluation prior to any refills on medication

## 2020-12-18 NOTE — Telephone Encounter (Signed)
Pt would like to know if she could geta  refill on her muscle relaxer's and pain medication..  Royann Shivers [phone]

## 2020-12-22 ENCOUNTER — Telehealth: Payer: Self-pay | Admitting: Orthopedic Surgery

## 2020-12-22 NOTE — Telephone Encounter (Signed)
Pt called requesting a refill of oxycodone and muscle relaxer been sent to pharmacy on file. Please call patient at [phone] when meds have been called in.

## 2020-12-23 NOTE — Telephone Encounter (Signed)
IC advised per note on 07/07 needs follow up appt Scheduled for 07/25

## 2021-01-05 ENCOUNTER — Ambulatory Visit: Payer: Medicare Other | Admitting: Orthopedic Surgery

## 2022-08-12 NOTE — Telephone Encounter (Signed)
Error
